# Patient Record
Sex: Female | Born: 1960
Health system: Southern US, Community
[De-identification: ages and names within clinical notes are randomized; demographics above are authoritative.]

---

## 2000-02-25 ENCOUNTER — Emergency Department (HOSPITAL_COMMUNITY): Admission: EM | Admit: 2000-02-25 | Discharge: 2000-02-25 | Payer: Self-pay

## 2003-03-20 ENCOUNTER — Ambulatory Visit (HOSPITAL_BASED_OUTPATIENT_CLINIC_OR_DEPARTMENT_OTHER): Admission: RE | Admit: 2003-03-20 | Discharge: 2003-03-20 | Payer: Self-pay | Admitting: Orthopedic Surgery

## 2003-03-20 ENCOUNTER — Ambulatory Visit (HOSPITAL_COMMUNITY): Admission: RE | Admit: 2003-03-20 | Discharge: 2003-03-20 | Payer: Self-pay | Admitting: Orthopedic Surgery

## 2003-09-02 ENCOUNTER — Ambulatory Visit (HOSPITAL_BASED_OUTPATIENT_CLINIC_OR_DEPARTMENT_OTHER): Admission: RE | Admit: 2003-09-02 | Discharge: 2003-09-02 | Payer: Self-pay | Admitting: Orthopedic Surgery

## 2003-09-02 ENCOUNTER — Ambulatory Visit (HOSPITAL_COMMUNITY): Admission: RE | Admit: 2003-09-02 | Discharge: 2003-09-02 | Payer: Self-pay | Admitting: Orthopedic Surgery

## 2005-03-14 ENCOUNTER — Other Ambulatory Visit: Admission: RE | Admit: 2005-03-14 | Discharge: 2005-03-14 | Payer: Self-pay | Admitting: Obstetrics and Gynecology

## 2014-08-17 ENCOUNTER — Other Ambulatory Visit: Payer: Self-pay | Admitting: Obstetrics and Gynecology

## 2014-08-17 DIAGNOSIS — N644 Mastodynia: Secondary | ICD-10-CM

## 2014-08-24 ENCOUNTER — Ambulatory Visit
Admission: RE | Admit: 2014-08-24 | Discharge: 2014-08-24 | Disposition: A | Discharge status: Home / Self Care | Payer: 59 | Source: Ambulatory Visit | Attending: Obstetrics and Gynecology | Admitting: Obstetrics and Gynecology

## 2014-08-24 DIAGNOSIS — N644 Mastodynia: Secondary | ICD-10-CM

## 2015-09-17 ENCOUNTER — Other Ambulatory Visit: Payer: Self-pay

## 2015-09-17 DIAGNOSIS — Z1231 Encounter for screening mammogram for malignant neoplasm of breast: Secondary | ICD-10-CM

## 2015-10-04 ENCOUNTER — Ambulatory Visit
Admission: RE | Admit: 2015-10-04 | Discharge: 2015-10-04 | Disposition: A | Discharge status: Home / Self Care | Payer: 59 | Source: Ambulatory Visit

## 2015-10-04 DIAGNOSIS — Z1231 Encounter for screening mammogram for malignant neoplasm of breast: Secondary | ICD-10-CM

## 2015-12-14 ENCOUNTER — Encounter: Payer: Self-pay | Admitting: *Deleted

## 2016-08-28 DIAGNOSIS — I10 Essential (primary) hypertension: Secondary | ICD-10-CM | POA: Diagnosis not present

## 2016-08-28 DIAGNOSIS — E1165 Type 2 diabetes mellitus with hyperglycemia: Secondary | ICD-10-CM | POA: Diagnosis not present

## 2016-08-28 DIAGNOSIS — E782 Mixed hyperlipidemia: Secondary | ICD-10-CM | POA: Diagnosis not present

## 2016-09-14 DIAGNOSIS — L738 Other specified follicular disorders: Secondary | ICD-10-CM | POA: Diagnosis not present

## 2016-09-14 DIAGNOSIS — L820 Inflamed seborrheic keratosis: Secondary | ICD-10-CM | POA: Diagnosis not present

## 2016-09-14 DIAGNOSIS — L570 Actinic keratosis: Secondary | ICD-10-CM | POA: Diagnosis not present

## 2016-09-14 DIAGNOSIS — L720 Epidermal cyst: Secondary | ICD-10-CM | POA: Diagnosis not present

## 2016-09-14 DIAGNOSIS — Z85828 Personal history of other malignant neoplasm of skin: Secondary | ICD-10-CM | POA: Diagnosis not present

## 2016-11-13 DIAGNOSIS — I10 Essential (primary) hypertension: Secondary | ICD-10-CM | POA: Diagnosis not present

## 2016-11-13 DIAGNOSIS — E1165 Type 2 diabetes mellitus with hyperglycemia: Secondary | ICD-10-CM | POA: Diagnosis not present

## 2017-01-19 DIAGNOSIS — E103313 Type 1 diabetes mellitus with moderate nonproliferative diabetic retinopathy with macular edema, bilateral: Secondary | ICD-10-CM | POA: Diagnosis not present

## 2017-01-19 DIAGNOSIS — H524 Presbyopia: Secondary | ICD-10-CM | POA: Diagnosis not present

## 2017-01-19 DIAGNOSIS — H5203 Hypermetropia, bilateral: Secondary | ICD-10-CM | POA: Diagnosis not present

## 2017-01-23 DIAGNOSIS — E113313 Type 2 diabetes mellitus with moderate nonproliferative diabetic retinopathy with macular edema, bilateral: Secondary | ICD-10-CM | POA: Diagnosis not present

## 2017-03-13 DIAGNOSIS — E1165 Type 2 diabetes mellitus with hyperglycemia: Secondary | ICD-10-CM | POA: Diagnosis not present

## 2017-03-13 DIAGNOSIS — I10 Essential (primary) hypertension: Secondary | ICD-10-CM | POA: Diagnosis not present

## 2017-05-09 DIAGNOSIS — E113313 Type 2 diabetes mellitus with moderate nonproliferative diabetic retinopathy with macular edema, bilateral: Secondary | ICD-10-CM | POA: Diagnosis not present

## 2017-07-27 DIAGNOSIS — E782 Mixed hyperlipidemia: Secondary | ICD-10-CM | POA: Diagnosis not present

## 2017-07-27 DIAGNOSIS — E1165 Type 2 diabetes mellitus with hyperglycemia: Secondary | ICD-10-CM | POA: Diagnosis not present

## 2017-07-27 DIAGNOSIS — I10 Essential (primary) hypertension: Secondary | ICD-10-CM | POA: Diagnosis not present

## 2017-10-31 DIAGNOSIS — I10 Essential (primary) hypertension: Secondary | ICD-10-CM | POA: Diagnosis not present

## 2017-10-31 DIAGNOSIS — E1165 Type 2 diabetes mellitus with hyperglycemia: Secondary | ICD-10-CM | POA: Diagnosis not present

## 2017-10-31 DIAGNOSIS — E782 Mixed hyperlipidemia: Secondary | ICD-10-CM | POA: Diagnosis not present

## 2017-11-29 DIAGNOSIS — E113313 Type 2 diabetes mellitus with moderate nonproliferative diabetic retinopathy with macular edema, bilateral: Secondary | ICD-10-CM | POA: Diagnosis not present

## 2017-11-29 DIAGNOSIS — Q143 Congenital malformation of choroid: Secondary | ICD-10-CM | POA: Diagnosis not present

## 2018-02-27 ENCOUNTER — Other Ambulatory Visit: Payer: Self-pay | Admitting: Family Medicine

## 2018-02-27 DIAGNOSIS — Z1231 Encounter for screening mammogram for malignant neoplasm of breast: Secondary | ICD-10-CM

## 2018-02-28 DIAGNOSIS — E1165 Type 2 diabetes mellitus with hyperglycemia: Secondary | ICD-10-CM | POA: Diagnosis not present

## 2018-02-28 DIAGNOSIS — I10 Essential (primary) hypertension: Secondary | ICD-10-CM | POA: Diagnosis not present

## 2018-02-28 DIAGNOSIS — Z713 Dietary counseling and surveillance: Secondary | ICD-10-CM | POA: Diagnosis not present

## 2018-03-22 DIAGNOSIS — D1801 Hemangioma of skin and subcutaneous tissue: Secondary | ICD-10-CM | POA: Diagnosis not present

## 2018-03-22 DIAGNOSIS — L570 Actinic keratosis: Secondary | ICD-10-CM | POA: Diagnosis not present

## 2018-03-22 DIAGNOSIS — L738 Other specified follicular disorders: Secondary | ICD-10-CM | POA: Diagnosis not present

## 2018-03-22 DIAGNOSIS — Z85828 Personal history of other malignant neoplasm of skin: Secondary | ICD-10-CM | POA: Diagnosis not present

## 2018-03-22 DIAGNOSIS — D2371 Other benign neoplasm of skin of right lower limb, including hip: Secondary | ICD-10-CM | POA: Diagnosis not present

## 2018-04-01 ENCOUNTER — Encounter: Payer: Self-pay | Admitting: Radiology

## 2018-04-01 ENCOUNTER — Ambulatory Visit
Admission: RE | Admit: 2018-04-01 | Discharge: 2018-04-01 | Disposition: A | Discharge status: Home / Self Care | Payer: 59 | Source: Ambulatory Visit | Attending: Family Medicine | Admitting: Family Medicine

## 2018-04-01 DIAGNOSIS — Z1231 Encounter for screening mammogram for malignant neoplasm of breast: Secondary | ICD-10-CM | POA: Diagnosis not present

## 2018-04-02 DIAGNOSIS — E113313 Type 2 diabetes mellitus with moderate nonproliferative diabetic retinopathy with macular edema, bilateral: Secondary | ICD-10-CM | POA: Diagnosis not present

## 2018-04-02 DIAGNOSIS — H43812 Vitreous degeneration, left eye: Secondary | ICD-10-CM | POA: Diagnosis not present

## 2018-05-01 DIAGNOSIS — C44519 Basal cell carcinoma of skin of other part of trunk: Secondary | ICD-10-CM | POA: Diagnosis not present

## 2018-05-01 DIAGNOSIS — C44511 Basal cell carcinoma of skin of breast: Secondary | ICD-10-CM | POA: Diagnosis not present

## 2018-07-25 DIAGNOSIS — E113212 Type 2 diabetes mellitus with mild nonproliferative diabetic retinopathy with macular edema, left eye: Secondary | ICD-10-CM | POA: Diagnosis not present

## 2018-07-25 DIAGNOSIS — E113311 Type 2 diabetes mellitus with moderate nonproliferative diabetic retinopathy with macular edema, right eye: Secondary | ICD-10-CM | POA: Diagnosis not present

## 2018-07-25 DIAGNOSIS — H3582 Retinal ischemia: Secondary | ICD-10-CM | POA: Diagnosis not present

## 2018-08-22 DIAGNOSIS — E113311 Type 2 diabetes mellitus with moderate nonproliferative diabetic retinopathy with macular edema, right eye: Secondary | ICD-10-CM | POA: Diagnosis not present

## 2018-08-29 DIAGNOSIS — H6980 Other specified disorders of Eustachian tube, unspecified ear: Secondary | ICD-10-CM | POA: Diagnosis not present

## 2018-08-29 DIAGNOSIS — H6502 Acute serous otitis media, left ear: Secondary | ICD-10-CM | POA: Diagnosis not present

## 2018-08-29 DIAGNOSIS — H6692 Otitis media, unspecified, left ear: Secondary | ICD-10-CM | POA: Diagnosis not present

## 2018-09-05 DIAGNOSIS — L910 Hypertrophic scar: Secondary | ICD-10-CM | POA: Diagnosis not present

## 2018-09-09 DIAGNOSIS — E113212 Type 2 diabetes mellitus with mild nonproliferative diabetic retinopathy with macular edema, left eye: Secondary | ICD-10-CM | POA: Diagnosis not present

## 2018-09-25 DIAGNOSIS — E1165 Type 2 diabetes mellitus with hyperglycemia: Secondary | ICD-10-CM | POA: Diagnosis not present

## 2018-09-25 DIAGNOSIS — I10 Essential (primary) hypertension: Secondary | ICD-10-CM | POA: Diagnosis not present

## 2018-09-27 DIAGNOSIS — E782 Mixed hyperlipidemia: Secondary | ICD-10-CM | POA: Diagnosis not present

## 2018-09-27 DIAGNOSIS — E1165 Type 2 diabetes mellitus with hyperglycemia: Secondary | ICD-10-CM | POA: Diagnosis not present

## 2019-05-29 ENCOUNTER — Other Ambulatory Visit: Payer: Self-pay | Admitting: Obstetrics & Gynecology

## 2019-05-29 DIAGNOSIS — Z1231 Encounter for screening mammogram for malignant neoplasm of breast: Secondary | ICD-10-CM

## 2019-07-11 ENCOUNTER — Other Ambulatory Visit: Payer: Self-pay

## 2019-07-11 ENCOUNTER — Ambulatory Visit
Admission: RE | Admit: 2019-07-11 | Discharge: 2019-07-11 | Disposition: A | Discharge status: Home / Self Care | Payer: 59 | Source: Ambulatory Visit | Attending: Obstetrics & Gynecology | Admitting: Obstetrics & Gynecology

## 2019-07-11 DIAGNOSIS — Z1231 Encounter for screening mammogram for malignant neoplasm of breast: Secondary | ICD-10-CM

## 2020-07-16 ENCOUNTER — Other Ambulatory Visit: Payer: Self-pay | Admitting: Family Medicine

## 2020-07-16 DIAGNOSIS — Z1231 Encounter for screening mammogram for malignant neoplasm of breast: Secondary | ICD-10-CM

## 2020-08-30 ENCOUNTER — Inpatient Hospital Stay: Admission: RE | Admit: 2020-08-30 | Payer: 59 | Source: Ambulatory Visit

## 2020-09-02 ENCOUNTER — Ambulatory Visit: Payer: 59

## 2020-10-20 ENCOUNTER — Other Ambulatory Visit: Payer: Self-pay

## 2020-10-20 ENCOUNTER — Ambulatory Visit
Admission: RE | Admit: 2020-10-20 | Discharge: 2020-10-20 | Disposition: A | Discharge status: Home / Self Care | Payer: 59 | Source: Ambulatory Visit | Attending: Family Medicine | Admitting: Family Medicine

## 2020-10-20 DIAGNOSIS — Z1231 Encounter for screening mammogram for malignant neoplasm of breast: Secondary | ICD-10-CM

## 2021-09-16 ENCOUNTER — Other Ambulatory Visit: Payer: Self-pay | Admitting: Family Medicine

## 2021-09-16 DIAGNOSIS — Z1231 Encounter for screening mammogram for malignant neoplasm of breast: Secondary | ICD-10-CM

## 2021-11-03 ENCOUNTER — Ambulatory Visit
Admission: RE | Admit: 2021-11-03 | Discharge: 2021-11-03 | Disposition: A | Discharge status: Home / Self Care | Payer: 59 | Source: Ambulatory Visit | Attending: Family Medicine | Admitting: Family Medicine

## 2021-11-03 DIAGNOSIS — Z1231 Encounter for screening mammogram for malignant neoplasm of breast: Secondary | ICD-10-CM

## 2022-03-21 ENCOUNTER — Ambulatory Visit (INDEPENDENT_AMBULATORY_CARE_PROVIDER_SITE_OTHER): Payer: 59

## 2022-03-21 ENCOUNTER — Ambulatory Visit: Payer: 59 | Admitting: Podiatry

## 2022-03-21 DIAGNOSIS — M7750 Other enthesopathy of unspecified foot: Secondary | ICD-10-CM

## 2022-03-21 DIAGNOSIS — M76822 Posterior tibial tendinitis, left leg: Secondary | ICD-10-CM

## 2022-03-21 DIAGNOSIS — M7752 Other enthesopathy of left foot: Secondary | ICD-10-CM

## 2022-03-21 MED ORDER — MELOXICAM 15 MG PO TABS: 15.0000 mg | ORAL_TABLET | Freq: Every day | ORAL | 3 refills | Status: AC

## 2022-03-21 NOTE — Patient Instructions (Signed)
Posterior Tibial Tendinitis  Posterior tibial tendinitis is irritation of a tendon called the posterior tibial tendon. Your posterior tibial tendon is a cord-like tissue that connects bones of your lower leg and foot to a muscle that: Supports your arch. Helps you raise up on your toes. Helps you turn your foot down and in. This condition causes foot and ankle pain. It can also lead to a flat foot. What are the causes? This condition is most often caused by repeated stress to the tendon (overuse injury). It can also be caused by a sudden injury that stresses the tendon, such as landing on your foot after jumping or falling. What increases the risk? This condition is more likely to develop in: People who play a sport that involves putting a lot of pressure on the feet, such as: Basketball. Tennis. Soccer. Hockey. Runners. Females who are older than 61 years of age and are overweight. People with diabetes. People with decreased foot stability. People with flat feet. What are the signs or symptoms? Symptoms include: Pain in the inner ankle. Pain at the arch of your foot. Pain that gets worse with running, walking, or standing. Swelling on the inside of your ankle and foot. Weakness in your ankle or foot. Inability to stand up on tiptoe. Flattening of the arch of your foot. How is this diagnosed? This condition may be diagnosed based on: Your symptoms. Your medical history. A physical exam. Tests, such as: X-ray. MRI. Ultrasound. How is this treated? This condition may be treated by: Putting ice to the injured area. Taking NSAIDs, such as ibuprofen, to reduce pain and swelling. Wearing a special shoe or shoe insert to support your arch (orthotic). Having physical therapy. Replacing high-impact exercise with low-impact exercise, such as swimming or cycling. If your symptoms do not improve with these treatments, you may need to wear a splint, removable walking boot, or short  leg cast for 6-8 weeks to keep your foot and ankle still (immobilized). Follow these instructions at home: If you have a cast, splint, or boot: Keep it clean and dry. Check the skin around it every day. Tell your health care provider about any concerns. If you have a cast: Do not stick anything inside it to scratch your skin. Doing that increases your risk of infection. You may put lotion on dry skin around the edges of the cast. Do not put lotion on the skin underneath the cast. If you have a splint or boot: Wear it as told by your health care provider. Remove it only as told by your health care provider. Loosen it if your toes tingle, become numb, or turn cold and blue. Bathing Do not take baths, swim, or use a hot tub until your health care provider approves. Ask your health care provider if you may take showers. If your cast, splint, or boot is not waterproof: Do not let it get wet. Cover it with a waterproof covering while you take a bath or a shower. Managing pain and swelling   If directed, put ice on the injured area. If you have a removable splint or boot, remove it as told by your health care provider. Put ice in a plastic bag. Place a towel between your skin and the bag or between your cast and the bag. Leave the ice on for 20 minutes, 2-3 times a day. Move your toes often to reduce stiffness and swelling. Raise (elevate) the injured area above the level of your heart while you are sitting  or lying down. Activity Do not use the injured foot to support your body weight until your health care provider says that you can. Use crutches as told by your health care provider. Do not do activities that make pain or swelling worse. Ask your health care provider when it is safe to drive if you have a cast, splint, or boot on your foot. Return to your normal activities as told by your health care provider. Ask your health care provider what activities are safe for you. Do exercises as  told by your health care provider. General instructions Take over-the-counter and prescription medicines only as told by your health care provider. If you have an orthotic, use it as told by your health care provider. Keep all follow-up visits as told by your health care provider. This is important. How is this prevented? Wear footwear that is appropriate to your athletic activity. Avoid athletic activities that cause pain or swelling in your ankle or foot. Before being active, do range-of-motion and stretching exercises. If you develop pain or swelling while training, stop training. If you have pain or swelling that does not improve after a few days of rest, see your health care provider. If you start a new athletic activity, start gradually so you can build up your strength and flexibility. Contact a health care provider if: Your symptoms get worse. Your symptoms do not improve in 6-8 weeks. You develop new, unexplained symptoms. Your splint, boot, or cast gets damaged. Summary Posterior tibial tendinitis is irritation of a tendon called the posterior tibial tendon. This condition is most often caused by repeated stress to the tendon (overuse injury). This condition causes foot pain and ankle pain. It can also lead to a flat foot. This condition may be treated by not doing high-impact activities, applying ice, having physical therapy, wearing orthotics, and wearing a cast, splint, or boot if needed. This information is not intended to replace advice given to you by your health care provider. Make sure you discuss any questions you have with your health care provider. Document Revised: 10/08/2018 Document Reviewed: 08/15/2018 [employer] Patient Education  [employer].  Posterior Tibial Tendinitis Rehab Ask your health care provider which exercises are safe for you. Do exercises exactly as told by your health care provider and adjust them as directed. It is normal to feel mild  stretching, pulling, tightness, or discomfort as you do these exercises. Stop right away if you feel sudden pain or your pain gets worse. Do not begin these exercises until told by your health care provider. Stretching and range-of-motion exercises These exercises warm up your muscles and joints and improve the movement and flexibility in your ankle and foot. These exercises may also help to relieve pain. Standing wall calf stretch, knee straight   Stand with your hands against a wall. Extend your left / right leg behind you, and bend your front knee slightly. If directed, place a folded washcloth under the arch of your foot for support. Point the toes of your back foot slightly inward. Keeping your heels on the floor and your back knee straight, shift your weight toward the wall. Do not allow your back to arch. You should feel a gentle stretch in your upper left / right calf. Hold this position for 10 seconds. Repeat 10 times. Complete this exercise 2 times a day. Standing wall calf stretch, knee bent Stand with your hands against a wall. Extend your left / right leg behind you, and bend your front  knee slightly. If directed, place a folded washcloth under the arch of your foot for support. Point the toes of your back foot slightly inward. Unlock your back knee so it is bent. Keep your heels on the floor. You should feel a gentle stretch deep in your lower left / right calf. Hold this position for 10 seconds. Repeat 10 times. Complete this exercise 2 times a day. Strengthening exercises These exercises build strength and endurance in your ankle and foot. Endurance is the ability to use your muscles for a long time, even after they get tired. Ankle inversion with band Secure one end of a rubber exercise band or tubing to a fixed object, such as a table leg or a pole, that will stay still when the band is pulled. Loop the other end of the band around the middle of your left / right foot. Sit  on the floor facing the object with your left / right leg extended. The band or tube should be slightly tense when your foot is relaxed. Leading with your big toe, slowly bring your left / right foot and ankle inward, toward your other foot (inversion). Hold this position for 10 seconds. Slowly return your foot to the starting position. Repeat 10 times. Complete this exercise 2 times a day. Towel curls   Sit in a chair on a non-carpeted surface, and put your feet on the floor. Place a towel in front of your feet. Keeping your heel on the floor, put your left / right foot on the towel. Pull the towel toward you by grabbing the towel with your toes and curling them under. Keep your heel on the floor while you do this. Let your toes relax. Grab the towel with your toes again. Keep going until the towel is completely underneath your foot. Repeat 10 times. Complete this exercise 2 times a day. Balance exercise This exercise improves or maintains your balance. Balance is important in preventing falls. Single leg stand Without wearing shoes, stand near a railing or in a doorway. You may hold on to the railing or door frame as needed for balance. Stand on your left / right foot. Keep your big toe down on the floor and try to keep your arch lifted. If balancing in this position is too easy, try the exercise with your eyes closed or while standing on a pillow. Hold this position for 10 seconds. Repeat 10 times. Complete this exercise 2 times a day. This information is not intended to replace advice given to you by your health care provider. Make sure you discuss any questions you have with your health care provider.  

## 2022-03-24 ENCOUNTER — Encounter: Payer: Self-pay | Admitting: Podiatry

## 2022-03-24 DIAGNOSIS — I10 Essential (primary) hypertension: Secondary | ICD-10-CM | POA: Insufficient documentation

## 2022-03-24 DIAGNOSIS — E119 Type 2 diabetes mellitus without complications: Secondary | ICD-10-CM | POA: Insufficient documentation

## 2022-03-24 NOTE — Progress Notes (Signed)
  Subjective:  Patient ID: Gearlean Alf, female    DOB: 09-12-1960,  MRN: [id number]  Chief Complaint  Patient presents with   Diabetes    Diabetic foot care/ exam   Foot Pain    Heel pain left   Numbness    Numbness and tingling in both feet    61 y.o. female presents with the above complaint. History confirmed with patient.  Says her diabetes is well controlled.  She has pain in along the arch  Objective:  Physical Exam: warm, good capillary refill, no trophic changes or ulcerative lesions, normal DP and PT pulses, normal sensory exam, and pain on the left PT tendon, she has a normal monofilament exam.    Radiographs: Multiple views x-ray of the left foot: no fracture, dislocation, swelling or degenerative changes noted Assessment:   1. Posterior tibial tendinitis of left lower extremity      Plan:  Patient was evaluated and treated and all questions answered.  Discussed the etiology and treatment options for Achilles tendinitis including stretching, formal physical therapy with an eccentric exercises therapy plan, supportive shoegears such as a running shoe or sneaker, heel lifts, topical and oral medications.  We also discussed that I do not routinely perform injections in this area because of the risk of an increased damage or rupture of the tendon.  We also discussed the role of surgical treatment of this for patients who do not improve after exhausting non-surgical treatment options.  -XR reviewed with patient -Educated on stretching and icing of the affected limb. -Rx for Meloxicam. Advised on risks, benefits, and alternatives of the medication   Return in about 6 weeks (around 05/02/2022) for re-check posterior tibial tendinitis.

## 2022-05-02 ENCOUNTER — Ambulatory Visit: Payer: 59 | Admitting: Podiatry

## 2022-10-03 ENCOUNTER — Ambulatory Visit: Payer: 59 | Admitting: Podiatry

## 2022-12-14 ENCOUNTER — Other Ambulatory Visit: Payer: Self-pay | Admitting: Family Medicine

## 2022-12-14 DIAGNOSIS — Z1231 Encounter for screening mammogram for malignant neoplasm of breast: Secondary | ICD-10-CM

## 2023-02-07 ENCOUNTER — Ambulatory Visit: Payer: 59

## 2023-03-07 ENCOUNTER — Ambulatory Visit
Admission: RE | Admit: 2023-03-07 | Discharge: 2023-03-07 | Disposition: A | Discharge status: Home / Self Care | Payer: Medicare Other | Source: Ambulatory Visit | Attending: Family Medicine | Admitting: Family Medicine

## 2023-03-07 DIAGNOSIS — Z1231 Encounter for screening mammogram for malignant neoplasm of breast: Secondary | ICD-10-CM

## 2023-04-21 IMAGING — MG MM DIGITAL SCREENING BILAT W/ TOMO AND CAD
6 of 12 series · 6 of 36 positions shown · non-contrast
Comparison: Previous exam(s).

CLINICAL DATA: Screening.

EXAM:
DIGITAL SCREENING BILATERAL MAMMOGRAM WITH TOMOSYNTHESIS AND CAD
TECHNIQUE: Bilateral screening digital craniocaudal and mediolateral oblique
mammograms were obtained. Bilateral screening digital breast
tomosynthesis was performed. The images were evaluated with
computer-aided detection.

[L CC synth-2D (1 of 2)]
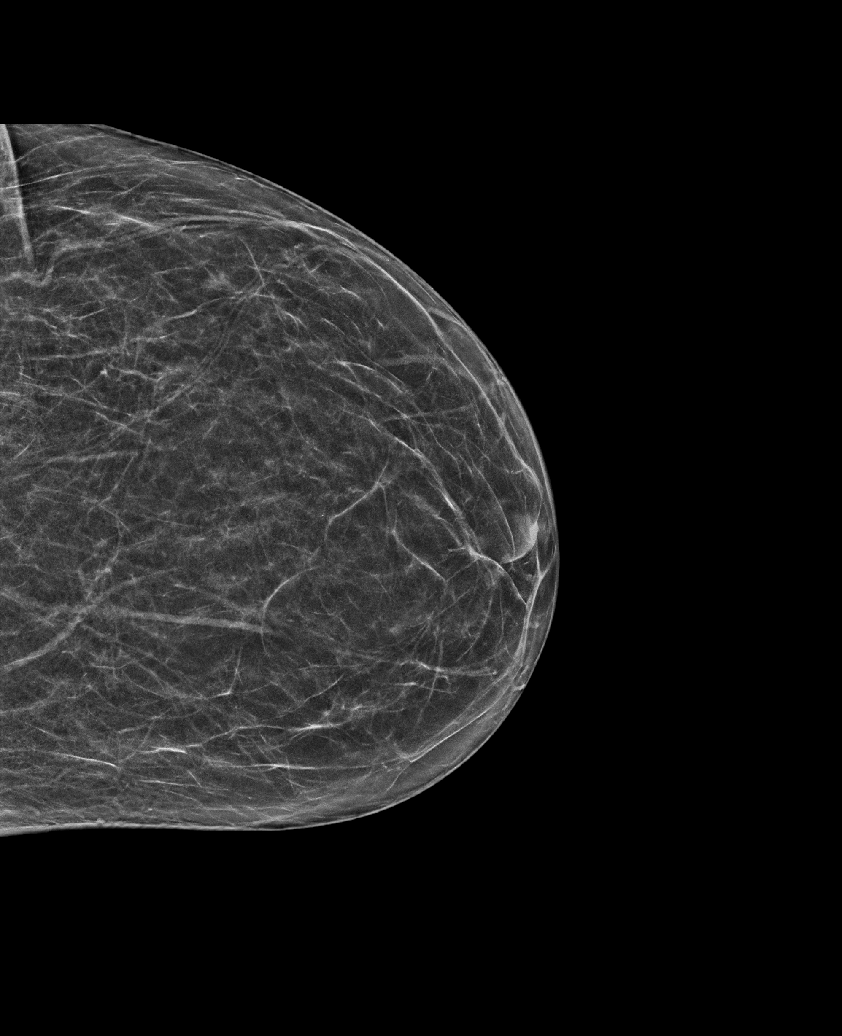

[R MLO synth-2D]
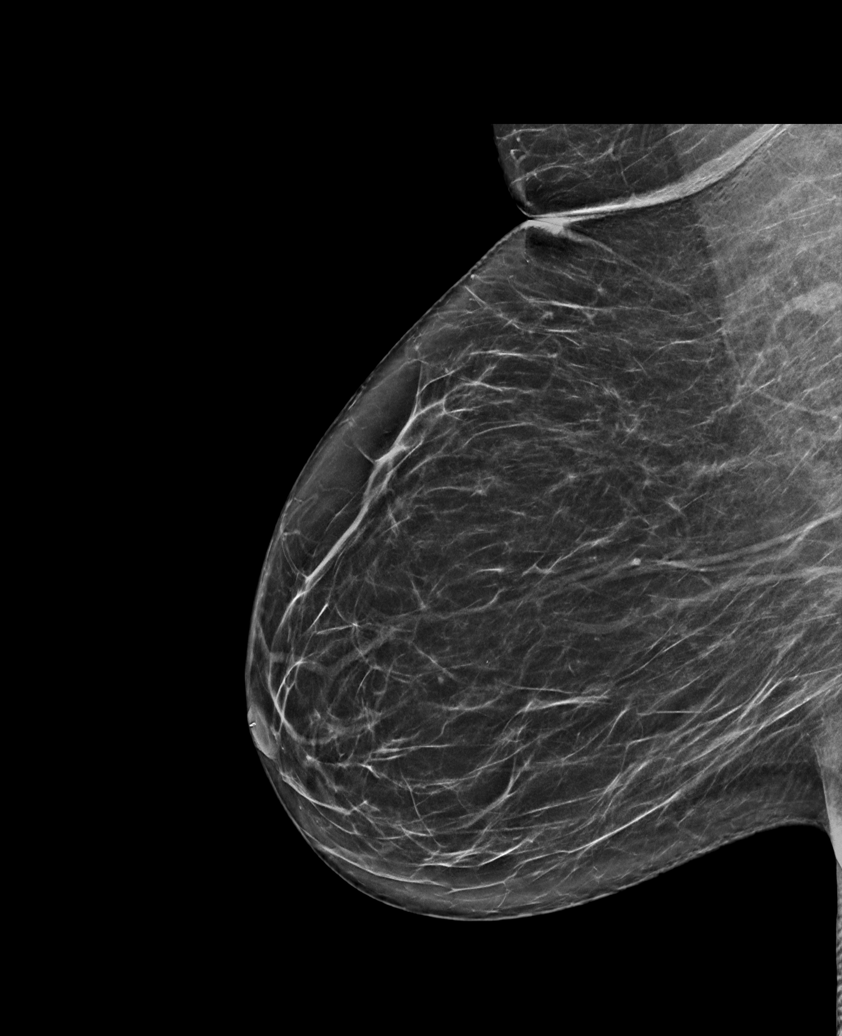

[L MLO synth-2D]
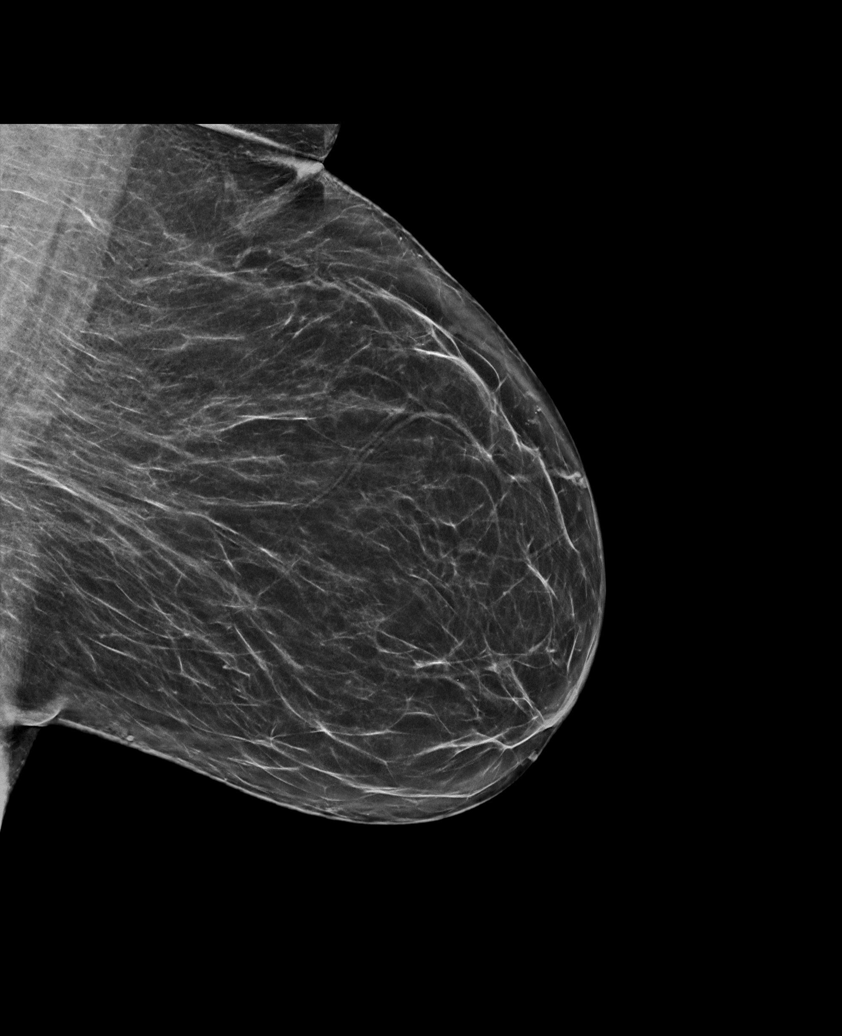

[R CC synth-2D (1 of 2)]
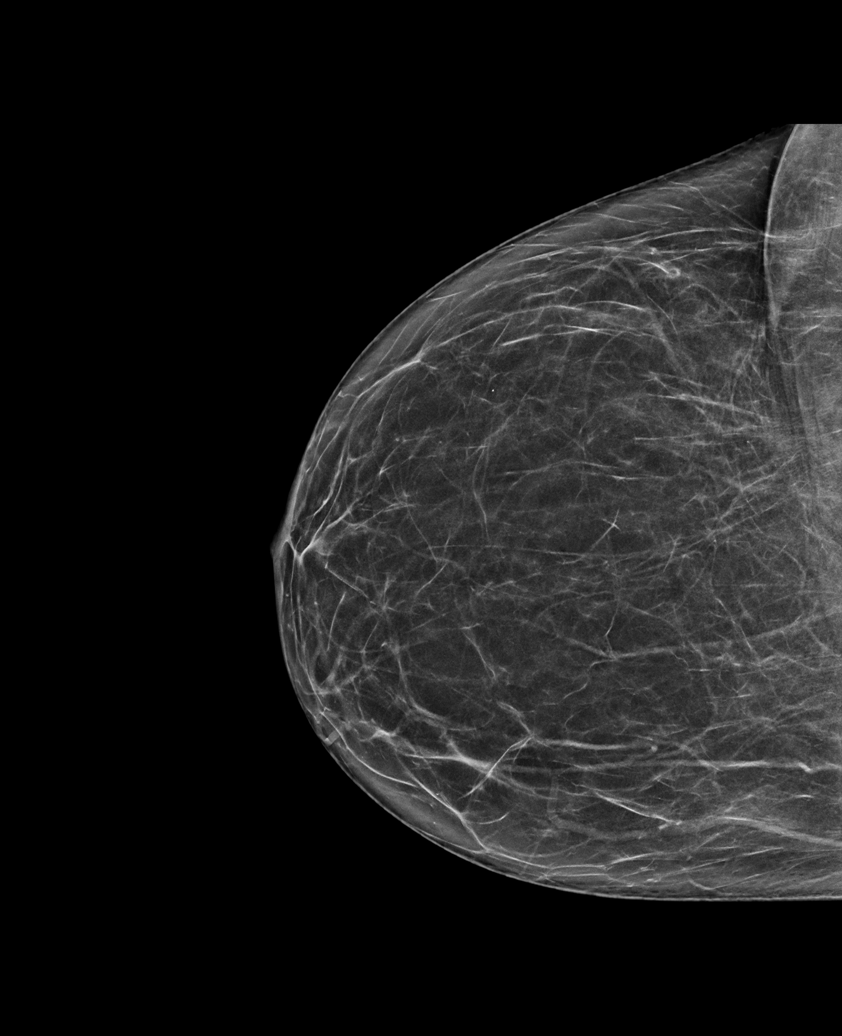

[R CC synth-2D (2 of 2)]
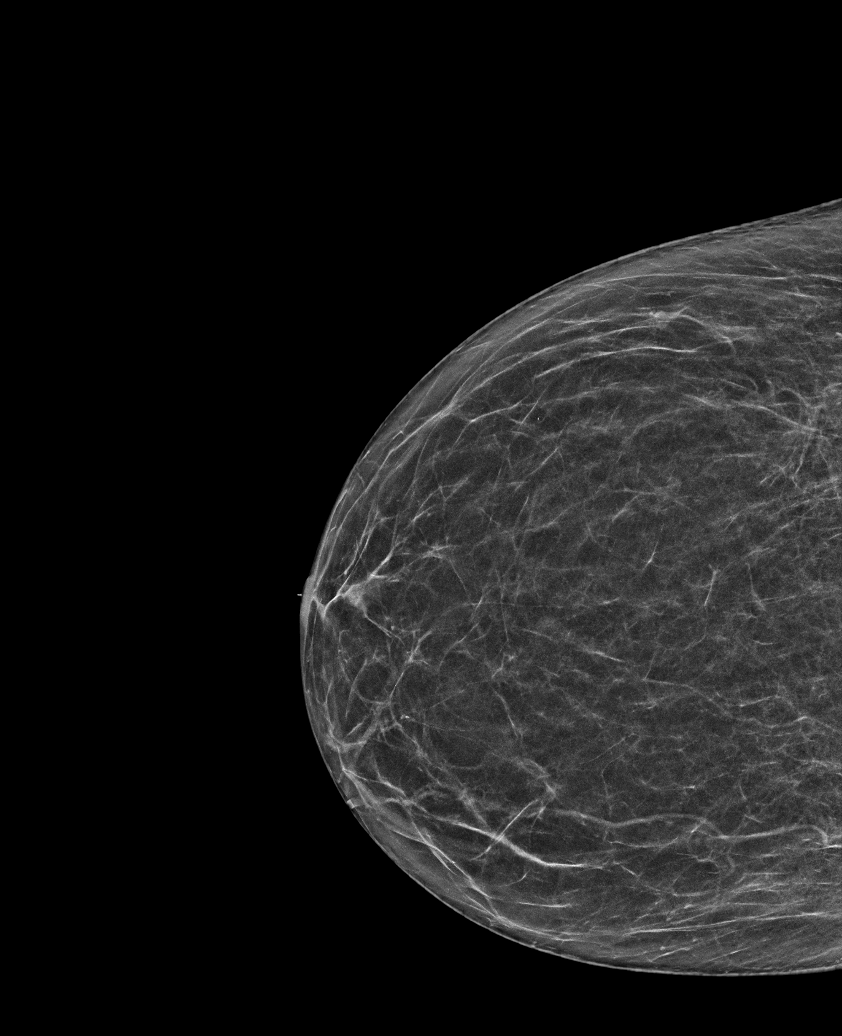

[L CC synth-2D (2 of 2)]
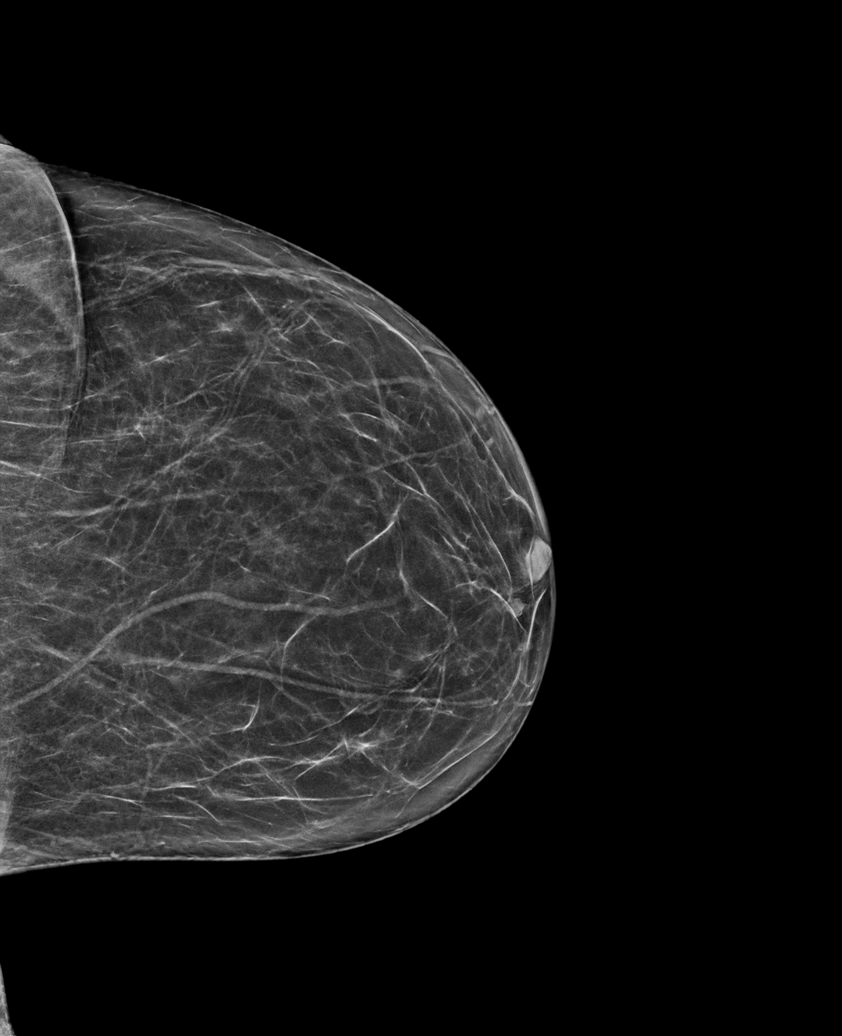

[6 of 36 positions shown; findings below may reference images not displayed]

ACR Breast Density Category b: There are scattered areas of
fibroglandular density.
FINDINGS: There are no findings suspicious for malignancy. The images were
evaluated with computer-aided detection.
IMPRESSION: No mammographic evidence of malignancy. A result letter of this
screening mammogram will be mailed directly to the patient.

RECOMMENDATION:
Screening mammogram in one year. (Code:[id number])

BI-RADS CATEGORY  1: Negative.

## 2024-02-06 ENCOUNTER — Other Ambulatory Visit: Payer: Self-pay | Admitting: Obstetrics and Gynecology

## 2024-02-06 DIAGNOSIS — Z1231 Encounter for screening mammogram for malignant neoplasm of breast: Secondary | ICD-10-CM

## 2024-02-27 DIAGNOSIS — G4709 Other insomnia: Secondary | ICD-10-CM | POA: Diagnosis not present

## 2024-02-27 DIAGNOSIS — Z6831 Body mass index (BMI) 31.0-31.9, adult: Secondary | ICD-10-CM | POA: Diagnosis not present

## 2024-02-27 DIAGNOSIS — Z01419 Encounter for gynecological examination (general) (routine) without abnormal findings: Secondary | ICD-10-CM | POA: Diagnosis not present

## 2024-03-05 DIAGNOSIS — H25013 Cortical age-related cataract, bilateral: Secondary | ICD-10-CM | POA: Diagnosis not present

## 2024-03-05 DIAGNOSIS — E113393 Type 2 diabetes mellitus with moderate nonproliferative diabetic retinopathy without macular edema, bilateral: Secondary | ICD-10-CM | POA: Diagnosis not present

## 2024-03-05 DIAGNOSIS — H2513 Age-related nuclear cataract, bilateral: Secondary | ICD-10-CM | POA: Diagnosis not present

## 2024-03-05 DIAGNOSIS — H5202 Hypermetropia, left eye: Secondary | ICD-10-CM | POA: Diagnosis not present

## 2024-03-13 DIAGNOSIS — H2513 Age-related nuclear cataract, bilateral: Secondary | ICD-10-CM | POA: Diagnosis not present

## 2024-03-13 DIAGNOSIS — E113392 Type 2 diabetes mellitus with moderate nonproliferative diabetic retinopathy without macular edema, left eye: Secondary | ICD-10-CM | POA: Diagnosis not present

## 2024-03-13 DIAGNOSIS — E113511 Type 2 diabetes mellitus with proliferative diabetic retinopathy with macular edema, right eye: Secondary | ICD-10-CM | POA: Diagnosis not present

## 2024-03-13 DIAGNOSIS — H43813 Vitreous degeneration, bilateral: Secondary | ICD-10-CM | POA: Diagnosis not present

## 2024-03-13 DIAGNOSIS — H35033 Hypertensive retinopathy, bilateral: Secondary | ICD-10-CM | POA: Diagnosis not present

## 2024-03-18 ENCOUNTER — Ambulatory Visit

## 2024-03-25 ENCOUNTER — Ambulatory Visit
Admission: RE | Admit: 2024-03-25 | Discharge: 2024-03-25 | Disposition: A | Source: Ambulatory Visit | Attending: Obstetrics and Gynecology

## 2024-03-25 DIAGNOSIS — Z1231 Encounter for screening mammogram for malignant neoplasm of breast: Secondary | ICD-10-CM

## 2024-05-05 DIAGNOSIS — G4700 Insomnia, unspecified: Secondary | ICD-10-CM | POA: Diagnosis not present

## 2024-05-05 DIAGNOSIS — F3341 Major depressive disorder, recurrent, in partial remission: Secondary | ICD-10-CM | POA: Diagnosis not present

## 2024-05-05 DIAGNOSIS — E1165 Type 2 diabetes mellitus with hyperglycemia: Secondary | ICD-10-CM | POA: Diagnosis not present

## 2024-05-05 DIAGNOSIS — I10 Essential (primary) hypertension: Secondary | ICD-10-CM | POA: Diagnosis not present

## 2024-05-05 DIAGNOSIS — E782 Mixed hyperlipidemia: Secondary | ICD-10-CM | POA: Diagnosis not present

## 2024-05-05 DIAGNOSIS — H269 Unspecified cataract: Secondary | ICD-10-CM | POA: Diagnosis not present

## 2024-05-05 DIAGNOSIS — H6992 Unspecified Eustachian tube disorder, left ear: Secondary | ICD-10-CM | POA: Diagnosis not present

## 2024-05-05 DIAGNOSIS — F419 Anxiety disorder, unspecified: Secondary | ICD-10-CM | POA: Diagnosis not present

## 2024-05-29 DIAGNOSIS — L720 Epidermal cyst: Secondary | ICD-10-CM | POA: Diagnosis not present

## 2024-05-29 DIAGNOSIS — L905 Scar conditions and fibrosis of skin: Secondary | ICD-10-CM | POA: Diagnosis not present

## 2024-05-29 DIAGNOSIS — Z85828 Personal history of other malignant neoplasm of skin: Secondary | ICD-10-CM | POA: Diagnosis not present

## 2024-05-29 DIAGNOSIS — L570 Actinic keratosis: Secondary | ICD-10-CM | POA: Diagnosis not present

## 2024-05-29 DIAGNOSIS — L821 Other seborrheic keratosis: Secondary | ICD-10-CM | POA: Diagnosis not present

## 2024-05-29 DIAGNOSIS — D2272 Melanocytic nevi of left lower limb, including hip: Secondary | ICD-10-CM | POA: Diagnosis not present

## 2024-05-29 DIAGNOSIS — L814 Other melanin hyperpigmentation: Secondary | ICD-10-CM | POA: Diagnosis not present
